# Patient Record
Sex: Male | Born: 1990 | Race: White | Hispanic: Yes | Marital: Married
Health system: Southern US, Community
[De-identification: ages and names within clinical notes are randomized; demographics above are authoritative.]

---
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
## 2018-02-12 ENCOUNTER — Ambulatory Visit (INDEPENDENT_AMBULATORY_CARE_PROVIDER_SITE_OTHER): Payer: Self-pay | Admitting: Physician Assistant

## 2018-02-12 ENCOUNTER — Ambulatory Visit (INDEPENDENT_AMBULATORY_CARE_PROVIDER_SITE_OTHER): Payer: Self-pay

## 2018-02-12 VITALS — BP 142/81 | HR 87 | Temp 97.8°F | Resp 18

## 2018-02-12 DIAGNOSIS — R079 Chest pain, unspecified: Secondary | ICD-10-CM

## 2018-02-12 DIAGNOSIS — R0789 Other chest pain: Secondary | ICD-10-CM

## 2018-02-12 DIAGNOSIS — R748 Abnormal levels of other serum enzymes: Secondary | ICD-10-CM

## 2018-02-12 LAB — POCT CBC
Granulocyte percent: 60.9 %G (ref 37–80)
HCT, POC: 49.7 % (ref 43.5–53.7)
HEMOGLOBIN: 16.9 g/dL (ref 14.1–18.1)
LYMPH, POC: 1.6 (ref 0.6–3.4)
MCH, POC: 32.9 pg — AB (ref 27–31.2)
MCHC: 34.1 g/dL (ref 31.8–35.4)
MCV: 96.6 fL (ref 80–97)
MID (CBC): 0.6 (ref 0–0.9)
MPV: 8.6 fL (ref 0–99.8)
POC Granulocyte: 3.4 (ref 2–6.9)
POC LYMPH %: 28.6 % (ref 10–50)
POC MID %: 10.5 % (ref 0–12)
Platelet Count, POC: 223 10*3/uL (ref 142–424)
RBC: 5.14 M/uL (ref 4.69–6.13)
RDW, POC: 13.3 %
WBC: 5.6 10*3/uL (ref 4.6–10.2)

## 2018-02-12 LAB — GLUCOSE, POCT (MANUAL RESULT ENTRY): POC Glucose: 92 mg/dl (ref 70–99)

## 2018-02-12 LAB — D-DIMER, QUANTITATIVE: D-DIMER: <0.2 mg/L FEU (ref 0.00–0.49)

## 2018-02-12 MED ORDER — HYDROXYZINE HCL 10 MG PO TABS: 10.0000 mg | ORAL_TABLET | Freq: Three times a day (TID) | ORAL | 0 refills | Status: DC | PRN

## 2018-02-12 NOTE — Patient Instructions (Addendum)
You blood work thus far. Physical exam, and chest xray are normal. You can try some hydroxazine while we are waiting on your results.    If you get any burning sensations in your stomach then try some Zantac 150.  This is over the counter.

## 2018-02-12 NOTE — Progress Notes (Signed)
02/12/2018 1:07 PM   DOB: [birth year] / MRN: [id number]  SUBJECTIVE:  Andrew Conway is a 27 y.o. male presenting for atypical chest pain. Symptoms present for about 4 hours.  The problem is no better or worse. He has tried nothing. He is a never smoker. Patient thinks that his morning "tater tots" may have caused this problem.   He has no allergies on file.   He  has no past medical history on file.    He   He  has no sexual activity history on file. The patient  has no past surgical history on file.  His family history is not on file.  Review of Systems  Constitutional: Negative for chills, diaphoresis and fever.  Eyes: Negative.   Respiratory: Negative for cough, hemoptysis, sputum production, shortness of breath and wheezing.   Cardiovascular: Negative for chest pain, orthopnea and leg swelling.  Gastrointestinal: Negative for abdominal pain, blood in stool, constipation, diarrhea, heartburn, melena, nausea and vomiting.  Genitourinary: Negative for dysuria, flank pain, frequency, hematuria and urgency.  Skin: Negative for rash.  Neurological: Positive for dizziness. Negative for sensory change, speech change, focal weakness and headaches.    The problem list and medications were reviewed and updated by myself where necessary and exist elsewhere in the encounter.   OBJECTIVE:  BP (!) 142/81 (BP Location: Right Arm, Patient Position: Sitting, Cuff Size: Normal)   Pulse 87   Temp 97.8 F (36.6 C) (Oral)   Resp 18   SpO2 94%   Orthostatic VS for the past 24 hrs:  BP- Lying BP- Standing at 0 minutes  02/12/18 1250 130/74 130/84   Temp Readings from Last 3 Encounters:  02/12/18 97.8 F (36.6 C) (Oral)   BP Readings from Last 3 Encounters:  02/12/18 (!) 142/81   Pulse Readings from Last 3 Encounters:  02/12/18 87    Physical Exam  Constitutional: He is oriented to person, place, and time. He appears well-developed. He does not appear ill.  Eyes: Pupils are equal,  round, and reactive to light. Conjunctivae and EOM are normal.  Cardiovascular: Normal rate, regular rhythm, S1 normal, S2 normal, normal heart sounds, intact distal pulses and normal pulses. Exam reveals no gallop and no friction rub.  No murmur heard. Pulmonary/Chest: Effort normal. No stridor. No respiratory distress. He has no wheezes. He has no rales.  Abdominal: He exhibits no distension.  Musculoskeletal: Normal range of motion. He exhibits no edema.  Neurological: He is alert and oriented to person, place, and time. No cranial nerve deficit. Coordination normal.  Skin: Skin is warm and dry. He is not diaphoretic.  Psychiatric: He has a normal mood and affect.  Nursing note and vitals reviewed.   No results found for: HGBA1C  Lab Results  Component Value Date   WBC 5.6 02/12/2018   HGB 16.9 02/12/2018   HCT 49.7 02/12/2018   MCV 96.6 02/12/2018     Results for orders placed or performed in visit on 02/12/18  POCT CBC  Result Value Ref Range   WBC 5.6 4.6 - 10.2 K/uL   Lymph, poc 1.6 0.6 - 3.4   POC LYMPH PERCENT 28.6 10 - 50 %L   MID (cbc) 0.6 0 - 0.9   POC MID % 10.5 0 - 12 %M   POC Granulocyte 3.4 2 - 6.9   Granulocyte percent 60.9 37 - 80 %G   RBC 5.14 4.69 - 6.13 M/uL   Hemoglobin 16.9 14.1 - 18.1 g/dL  HCT, POC 49.7 43.5 - 53.7 %   MCV 96.6 80 - 97 fL   MCH, POC 32.9 (A) 27 - 31.2 pg   MCHC 34.1 31.8 - 35.4 g/dL   RDW, POC 16.113.3 %   Platelet Count, POC 223 142 - 424 K/uL   MPV 8.6 0 - 99.8 fL  POCT glucose (manual entry)  Result Value Ref Range   POC Glucose 92 70 - 99 mg/dl     ASSESSMENT AND PLAN:  Diagnoses and all orders for this visit:  Atypical chest pain: Hemodynamically stable without any visible signs of illness. Will screen for PE.   -     EKG 12-Lead -     DG Chest 2 View; Future -     POCT CBC -     POCT glucose (manual entry) -     Comprehensive metabolic panel -     Urinalysis, dipstick only -     D-dimer, quantitative (not at  [facility])    The patient is advised to call or return to clinic if he does not see an improvement in symptoms, or to seek the care of the closest emergency department if he worsens with the above plan.   Deliah BostonMichael Clark, MHS, PA-C [facility] at [facility] 02/12/2018 1:07 PM

## 2018-02-13 LAB — URINALYSIS, DIPSTICK ONLY
Bilirubin, UA: NEGATIVE
Glucose, UA: NEGATIVE
Ketones, UA: NEGATIVE
LEUKOCYTES UA: NEGATIVE
NITRITE UA: NEGATIVE
RBC UA: NEGATIVE
Urobilinogen, Ur: 0.2 mg/dL (ref 0.2–1.0)
pH, UA: 6 (ref 5.0–7.5)

## 2018-02-13 LAB — COMPREHENSIVE METABOLIC PANEL
A/G RATIO: 1.5 (ref 1.2–2.2)
ALBUMIN: 4.6 g/dL (ref 3.5–5.5)
ALK PHOS: 136 IU/L — AB (ref 39–117)
ALT: 117 IU/L — AB (ref 0–44)
AST: 49 IU/L — ABNORMAL HIGH (ref 0–40)
BILIRUBIN TOTAL: 1.1 mg/dL (ref 0.0–1.2)
BUN / CREAT RATIO: 21 — AB (ref 9–20)
BUN: 16 mg/dL (ref 6–20)
CHLORIDE: 104 mmol/L (ref 96–106)
CO2: 20 mmol/L (ref 20–29)
Calcium: 9.2 mg/dL (ref 8.7–10.2)
Creatinine, Ser: 0.75 mg/dL — ABNORMAL LOW (ref 0.76–1.27)
GFR calc Af Amer: 145 mL/min/{1.73_m2} (ref >59)
GFR calc non Af Amer: 126 mL/min/{1.73_m2} (ref >59)
GLUCOSE: 107 mg/dL — AB (ref 65–99)
Globulin, Total: 3.1 g/dL (ref 1.5–4.5)
POTASSIUM: 5 mmol/L (ref 3.5–5.2)
Sodium: 141 mmol/L (ref 134–144)
Total Protein: 7.7 g/dL (ref 6.0–8.5)

## 2018-02-13 LAB — LIPID PANEL
CHOL/HDL RATIO: 5 ratio (ref 0.0–5.0)
Cholesterol, Total: 151 mg/dL (ref 100–199)
HDL: 30 mg/dL — ABNORMAL LOW (ref >39)
Triglycerides: 403 mg/dL — ABNORMAL HIGH (ref 0–149)

## 2018-02-14 NOTE — Progress Notes (Signed)
Please add on lipase. Dx abnormal liver enzymes. Deliah BostonMichael Jaclene Bartelt, MS, PA-C 2:13 PM, 02/14/2018

## 2018-02-15 ENCOUNTER — Telehealth: Payer: Self-pay | Admitting: Physician Assistant

## 2018-02-15 NOTE — Addendum Note (Signed)
Addended by: Rogelia RohrerMCADOO, Ashliegh Parekh K on: 02/15/2018 08:23 AM   Modules accepted: Orders

## 2018-02-15 NOTE — Telephone Encounter (Signed)
Copied from CRM [id number]. Topic: Quick Communication - Lab Results >> Feb 15, 2018  3:09 PM Percival SpanishKennedy, Cheryl W wrote: Would like a call back about lab results     [phone]

## 2018-02-16 ENCOUNTER — Other Ambulatory Visit: Payer: Self-pay | Admitting: Physician Assistant

## 2018-02-16 DIAGNOSIS — R945 Abnormal results of liver function studies: Principal | ICD-10-CM

## 2018-02-16 DIAGNOSIS — R7989 Other specified abnormal findings of blood chemistry: Secondary | ICD-10-CM

## 2018-02-16 LAB — LIPASE: Lipase: 31 U/L (ref 13–78)

## 2018-02-16 NOTE — Progress Notes (Signed)
Please relay the message below

## 2018-02-17 NOTE — Telephone Encounter (Signed)
Spoke to pt.  Gave results as listed with Deliah BostonMichael Clark  Sent message to Referrals.

## 2018-02-19 NOTE — Telephone Encounter (Signed)
[facility]

## 2018-12-22 IMAGING — DX DG CHEST 2V
2 series · 2 of 2 positions shown · non-contrast
Comparison: None.

CLINICAL DATA: Chest pain

EXAM:
CHEST - 2 VIEW

[chest pa]
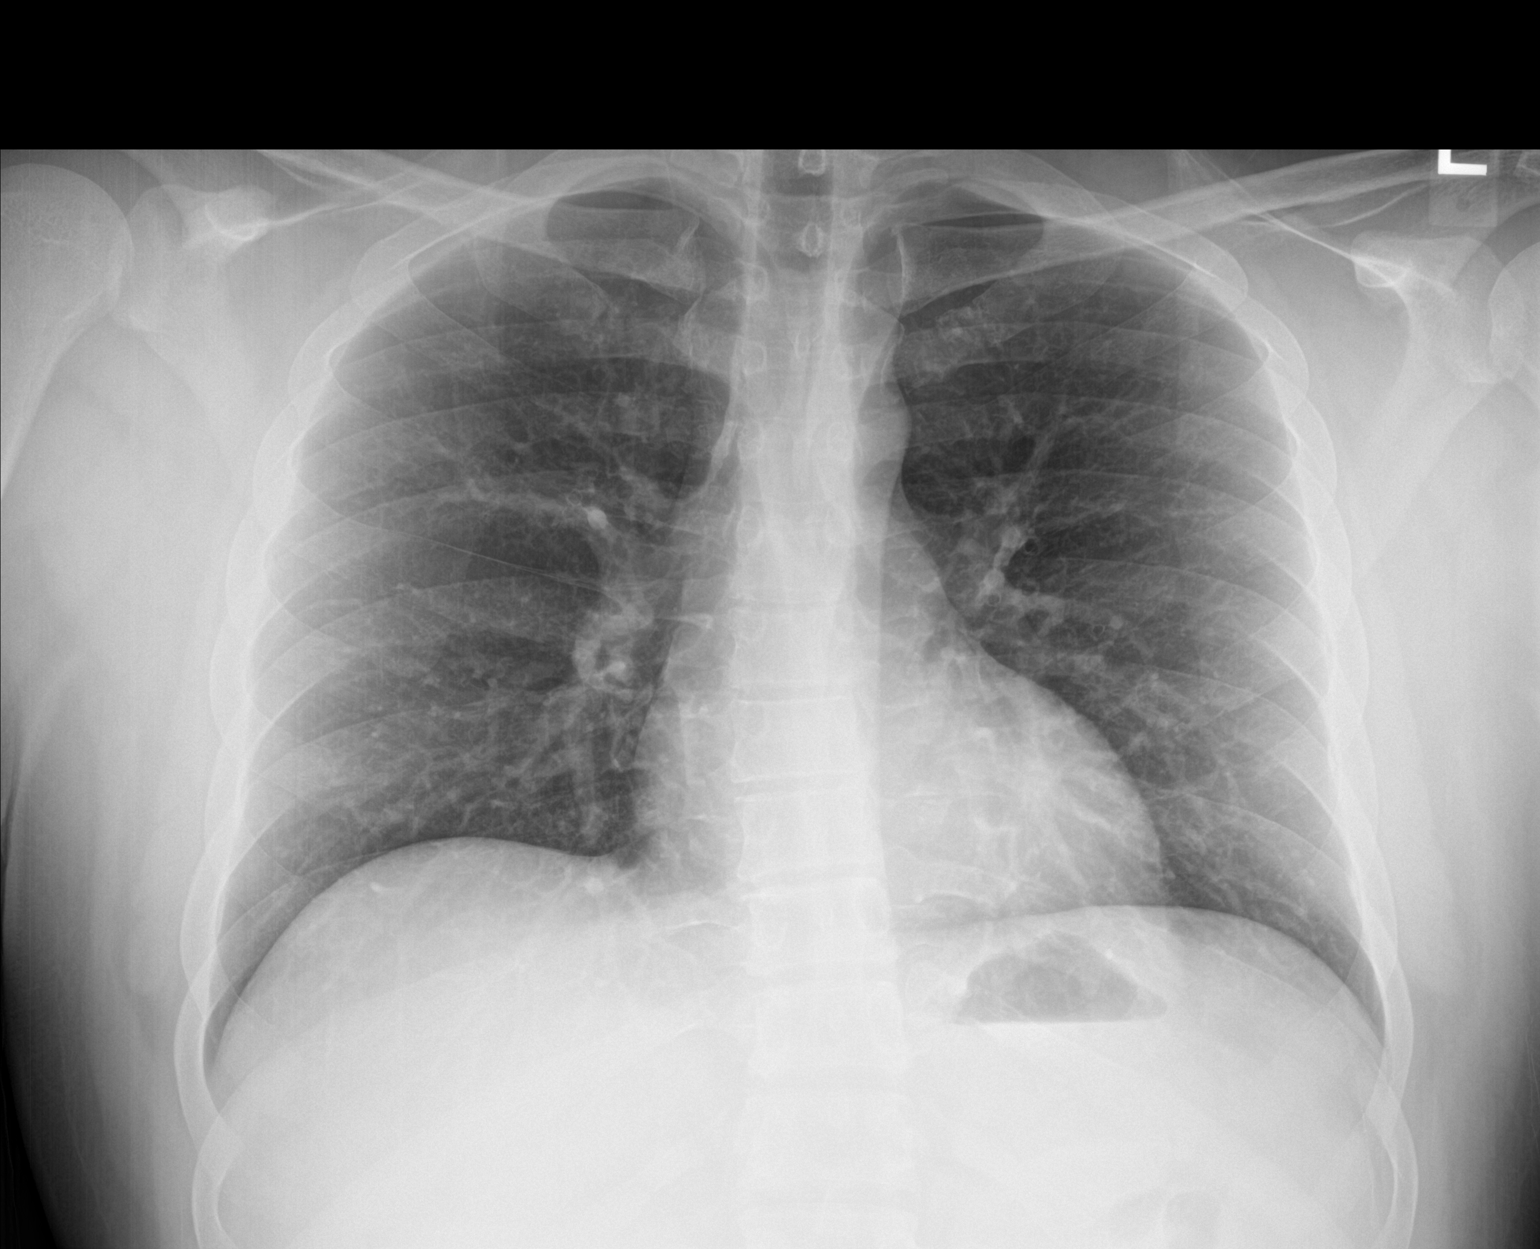

[chest lat]
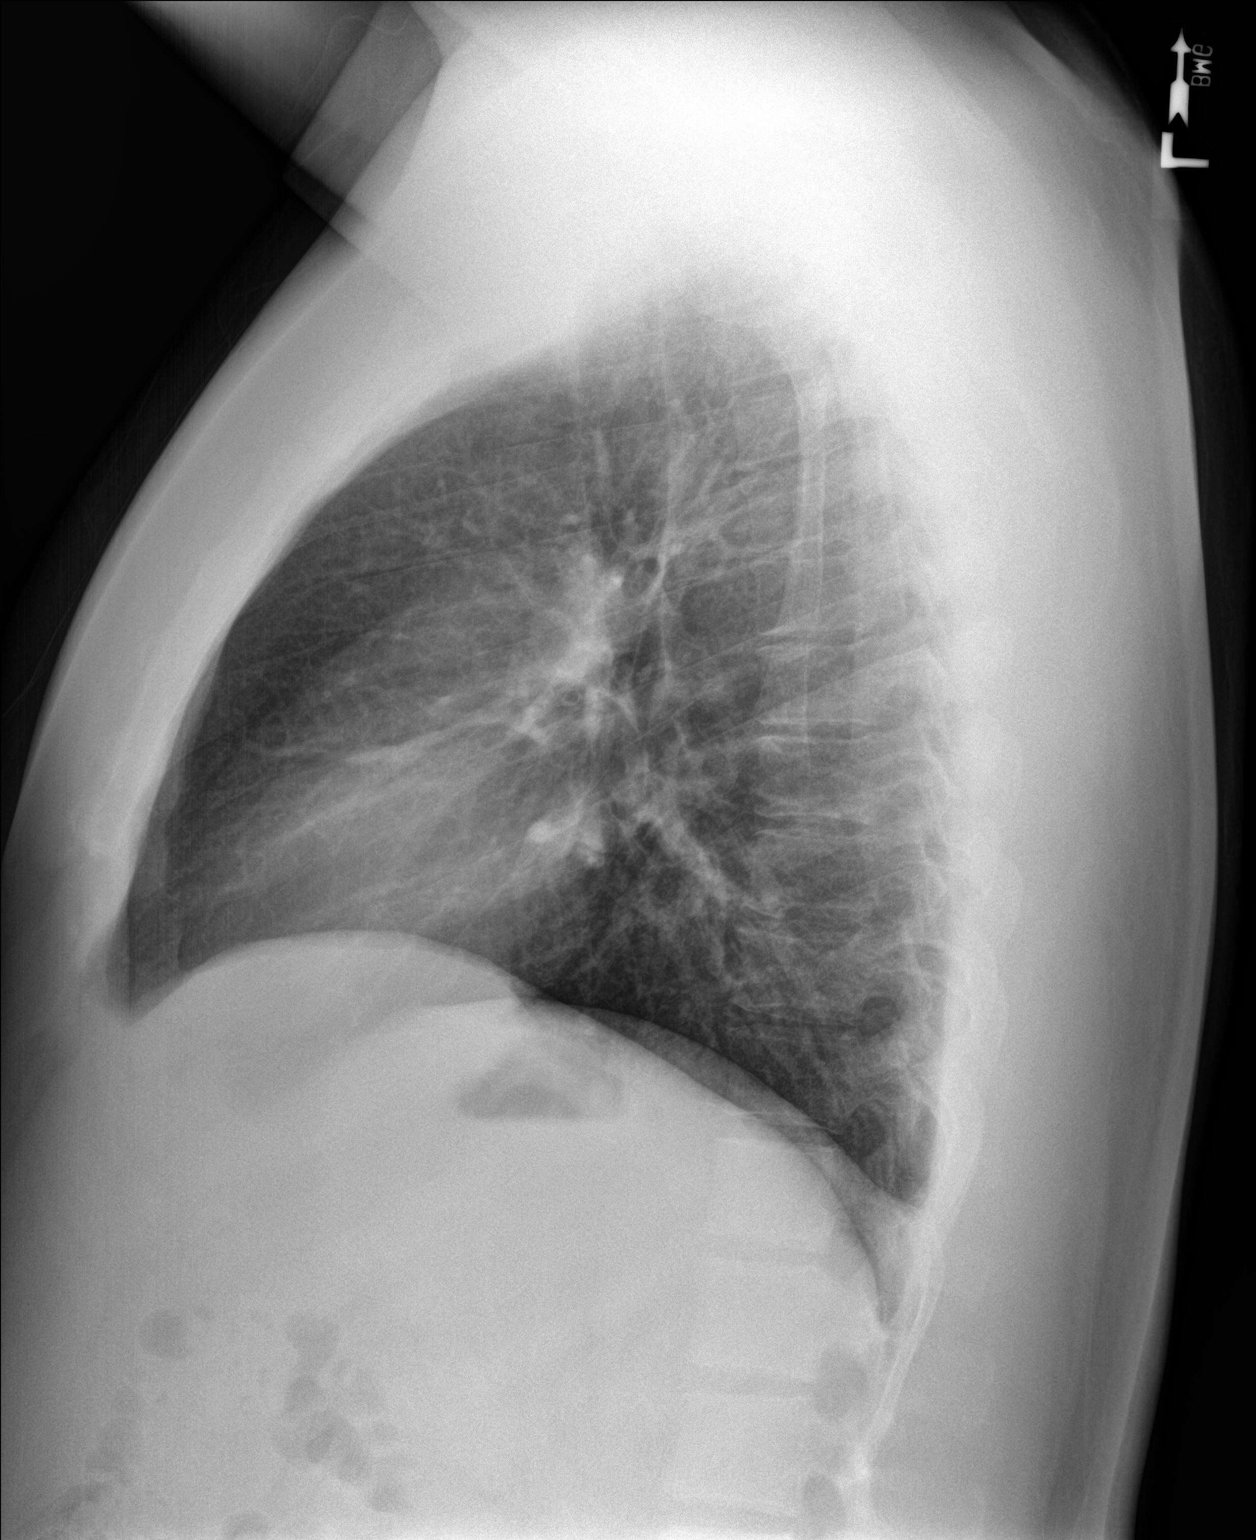

[2 of 2 positions shown; findings below may reference images not displayed]

FINDINGS: The heart size and mediastinal contours are within normal limits.
Both lungs are clear. The visualized skeletal structures are
unremarkable.
IMPRESSION: No active cardiopulmonary disease.

## 2021-12-04 ENCOUNTER — Emergency Department (HOSPITAL_COMMUNITY): Payer: Self-pay

## 2021-12-04 ENCOUNTER — Encounter (HOSPITAL_COMMUNITY): Payer: Self-pay

## 2021-12-04 ENCOUNTER — Other Ambulatory Visit: Payer: Self-pay

## 2021-12-04 ENCOUNTER — Emergency Department (HOSPITAL_COMMUNITY)
Admission: EM | Admit: 2021-12-04 | Discharge: 2021-12-04 | Disposition: A | Discharge status: Home / Self Care | Payer: Self-pay | Attending: Emergency Medicine | Admitting: Emergency Medicine

## 2021-12-04 DIAGNOSIS — R112 Nausea with vomiting, unspecified: Secondary | ICD-10-CM

## 2021-12-04 DIAGNOSIS — R7401 Elevation of levels of liver transaminase levels: Secondary | ICD-10-CM | POA: Insufficient documentation

## 2021-12-04 DIAGNOSIS — K824 Cholesterolosis of gallbladder: Secondary | ICD-10-CM | POA: Insufficient documentation

## 2021-12-04 DIAGNOSIS — R509 Fever, unspecified: Secondary | ICD-10-CM | POA: Insufficient documentation

## 2021-12-04 DIAGNOSIS — Z20822 Contact with and (suspected) exposure to covid-19: Secondary | ICD-10-CM | POA: Insufficient documentation

## 2021-12-04 DIAGNOSIS — D72829 Elevated white blood cell count, unspecified: Secondary | ICD-10-CM | POA: Insufficient documentation

## 2021-12-04 DIAGNOSIS — K700 Alcoholic fatty liver: Secondary | ICD-10-CM | POA: Insufficient documentation

## 2021-12-04 DIAGNOSIS — R519 Headache, unspecified: Secondary | ICD-10-CM | POA: Insufficient documentation

## 2021-12-04 DIAGNOSIS — K760 Fatty (change of) liver, not elsewhere classified: Secondary | ICD-10-CM

## 2021-12-04 LAB — CBC WITH DIFFERENTIAL/PLATELET
Abs Immature Granulocytes: 0.04 10*3/uL (ref 0.00–0.07)
Basophils Absolute: 0 10*3/uL (ref 0.0–0.1)
Basophils Relative: 0 %
Eosinophils Absolute: 0 10*3/uL (ref 0.0–0.5)
Eosinophils Relative: 0 %
HCT: 44.7 % (ref 39.0–52.0)
Hemoglobin: 16.6 g/dL (ref 13.0–17.0)
Immature Granulocytes: 0 %
Lymphocytes Relative: 12 %
Lymphs Abs: 1.5 10*3/uL (ref 0.7–4.0)
MCH: 33.2 pg (ref 26.0–34.0)
MCHC: 37.1 g/dL — ABNORMAL HIGH (ref 30.0–36.0)
MCV: 89.4 fL (ref 80.0–100.0)
Monocytes Absolute: 1.2 10*3/uL — ABNORMAL HIGH (ref 0.1–1.0)
Monocytes Relative: 10 %
Neutro Abs: 9.2 10*3/uL — ABNORMAL HIGH (ref 1.7–7.7)
Neutrophils Relative %: 78 %
Platelets: 200 10*3/uL (ref 150–400)
RBC: 5 MIL/uL (ref 4.22–5.81)
RDW: 12.6 % (ref 11.5–15.5)
WBC: 11.9 10*3/uL — ABNORMAL HIGH (ref 4.0–10.5)
nRBC: 0 % (ref 0.0–0.2)

## 2021-12-04 LAB — COMPREHENSIVE METABOLIC PANEL
ALT: 55 U/L — ABNORMAL HIGH (ref 0–44)
AST: 24 U/L (ref 15–41)
Albumin: 4.3 g/dL (ref 3.5–5.0)
Alkaline Phosphatase: 103 U/L (ref 38–126)
Anion gap: 9 (ref 5–15)
BUN: 13 mg/dL (ref 6–20)
CO2: 24 mmol/L (ref 22–32)
Calcium: 8.8 mg/dL — ABNORMAL LOW (ref 8.9–10.3)
Chloride: 103 mmol/L (ref 98–111)
Creatinine, Ser: 0.74 mg/dL (ref 0.61–1.24)
GFR, Estimated: >60 mL/min (ref >60)
Glucose, Bld: 96 mg/dL (ref 70–99)
Potassium: 4.5 mmol/L (ref 3.5–5.1)
Sodium: 136 mmol/L (ref 135–145)
Total Bilirubin: 2.3 mg/dL — ABNORMAL HIGH (ref 0.3–1.2)
Total Protein: 8.2 g/dL — ABNORMAL HIGH (ref 6.5–8.1)

## 2021-12-04 LAB — RESP PANEL BY RT-PCR (FLU A&B, COVID) ARPGX2
Influenza A by PCR: NEGATIVE
Influenza B by PCR: NEGATIVE
SARS Coronavirus 2 by RT PCR: NEGATIVE

## 2021-12-04 LAB — APTT: aPTT: 31 seconds (ref 24–36)

## 2021-12-04 LAB — URINALYSIS, ROUTINE W REFLEX MICROSCOPIC
Bilirubin Urine: NEGATIVE
Glucose, UA: NEGATIVE mg/dL
Hgb urine dipstick: NEGATIVE
Ketones, ur: NEGATIVE mg/dL
Leukocytes,Ua: NEGATIVE
Nitrite: NEGATIVE
Protein, ur: NEGATIVE mg/dL
Specific Gravity, Urine: 1.025 (ref 1.005–1.030)
pH: 6 (ref 5.0–8.0)

## 2021-12-04 LAB — LACTIC ACID, PLASMA: Lactic Acid, Venous: 1 mmol/L (ref 0.5–1.9)

## 2021-12-04 LAB — PROTIME-INR
INR: 1.2 (ref 0.8–1.2)
Prothrombin Time: 14.7 seconds (ref 11.4–15.2)

## 2021-12-04 MED ORDER — ACETAMINOPHEN 325 MG PO TABS
650.0000 mg | ORAL_TABLET | Freq: Once | ORAL | Status: DC
Start: 2021-12-04 — End: 2021-12-04

## 2021-12-04 MED ORDER — ACETAMINOPHEN 500 MG PO TABS
1000.0000 mg | ORAL_TABLET | Freq: Once | ORAL | Status: AC
  Administered 2021-12-04: 1000 mg via ORAL
  Filled 2021-12-04: qty 2

## 2021-12-04 MED ORDER — ONDANSETRON HCL 8 MG PO TABS: 8.0000 mg | ORAL_TABLET | Freq: Three times a day (TID) | ORAL | 0 refills | Status: DC | PRN

## 2021-12-04 NOTE — ED Provider Triage Note (Signed)
Emergency Medicine Provider Triage Evaluation Note ? ?Andrew Conway , a 31 y.o. male  was evaluated in triage.  Patient complains of abdominal pain since eating a fried sandwich with steak on Thursday.  Since then he has also been experiencing fevers as high as 102.  Has been taking Tylenol at home but continues to have recurrent fevers and severe abdominal pain. ? ?Review of Systems  ?Positive: Abdominal pain, nausea, fevers and chills ?Negative: Vomiting, diarrhea ? ?Physical Exam  ?BP (!) 115/93 (BP Location: Right Arm)   Pulse (!) 105   Temp (!) 101.9 ?F (38.8 ?C) (Oral)   Resp 16   SpO2 97%  ?Gen:   Awake, no distress   ?Resp:  Normal effort  ?MSK:   Moves extremities without difficulty  ?Other:  Right upper quadrant tenderness ? ?Medical Decision Making  ?Medically screening exam initiated at 1:32 PM.  Appropriate orders placed.  Andrew Conway was informed that the remainder of the evaluation will be completed by another provider, this initial triage assessment does not replace that evaluation, and the importance of remaining in the ED until their evaluation is complete. ? ? ? ?Febrile and tachycardic on presentation.  Question gallbladder versus gastroenteritis. ?  ?Rhae Hammock, PA-C ?12/04/21 1333 ? ?

## 2021-12-04 NOTE — Discharge Instructions (Addendum)
Please use acetaminophen 650 mg or ibuprofen 600 mg as needed for fever ?Please call for follow-up of your gallbladder polyp at the [facility] or the gastroenterology office. ?Return if you are having worsening symptoms especially inability to tolerate fluids, worsening pain or fever lasting more than 2 to 3 days ?

## 2021-12-04 NOTE — ED Triage Notes (Signed)
Patient complains of headache, fever, abdominal pain with nausea sine Thursday. No vomiting, no diarrhea. Alert and oriented ?

## 2021-12-04 NOTE — ED Provider Notes (Signed)
?[facility] ?Provider Note ? ? ?CSN: [id number] ?Arrival date & time: 12/04/21  1246 ? ?  ? ?History ? ?No chief complaint on file. ? ? ?Andrew Conway is a 31 y.o. male. ? ?HPI ? ?31 year old male no significant past medical history presents today complaining of fever, headache, nausea, vomiting, and some right upper quadrant discomfort for several days.  Patient seen prior to my evaluation in triage and had acetaminophen, labs, and right upper quadrant ultrasound.  Ultrasound is significant for polyp but no stones and no pericholecystic inflammation consistent with cholecystitis.  He does have some fatty infiltrates of his liver.  No other known sick contacts.  No cough, diarrhea. ?  ? ?Home Medications ?Prior to Admission medications   ?Medication Sig Start Date End Date Taking? Authorizing Provider  ?ondansetron (ZOFRAN) 8 MG tablet Take 1 tablet (8 mg total) by mouth every 8 (eight) hours as needed for nausea or vomiting. 12/04/21  Yes Pattricia Boss, MD  ?hydrOXYzine (ATARAX/VISTARIL) 10 MG tablet Take 1-3 tablets (10-30 mg total) by mouth 3 (three) times daily as needed. 02/12/18   Tereasa Coop, PA-C  ?   ? ?Allergies    ?Patient has no known allergies.   ? ?Review of Systems   ?Review of Systems  ?All other systems reviewed and are negative. ? ?Physical Exam ?Updated Vital Signs ?BP 105/73   Pulse 97   Temp 98.9 ?F (37.2 ?C) (Oral)   Resp 16   SpO2 96%  ?Physical Exam ?Vitals and nursing note reviewed.  ?Constitutional:   ?   Appearance: Normal appearance. He is obese.  ?HENT:  ?   Head: Normocephalic and atraumatic.  ?   Right Ear: External ear normal.  ?   Left Ear: External ear normal.  ?   Nose: Nose normal.  ?   Mouth/Throat:  ?   Mouth: Mucous membranes are dry.  ?   Pharynx: Oropharynx is clear.  ?Eyes:  ?   Extraocular Movements: Extraocular movements intact.  ?   Pupils: Pupils are equal, round, and reactive to light.  ?Cardiovascular:  ?   Rate and Rhythm:  Normal rate and regular rhythm.  ?   Pulses: Normal pulses.  ?Pulmonary:  ?   Effort: Pulmonary effort is normal.  ?   Breath sounds: Normal breath sounds.  ?Abdominal:  ?   General: Abdomen is flat. Bowel sounds are normal.  ?   Tenderness: There is abdominal tenderness.  ?   Comments: Mild right upper quadrant tenderness to palpation no other tenderness noted on exam  ?Musculoskeletal:     ?   General: Normal range of motion.  ?   Cervical back: Normal range of motion.  ?Skin: ?   General: Skin is warm and dry.  ?   Capillary Refill: Capillary refill takes less than 2 seconds.  ?Neurological:  ?   General: No focal deficit present.  ?   Mental Status: He is alert.  ?Psychiatric:     ?   Mood and Affect: Mood normal.  ? ? ?ED Results / Procedures / Treatments   ?Labs ?(all labs ordered are listed, but only abnormal results are displayed) ?Labs Reviewed  ?COMPREHENSIVE METABOLIC PANEL - Abnormal; Notable for the following components:  ?    Result Value  ? Calcium 8.8 (*)   ? Total Protein 8.2 (*)   ? ALT 55 (*)   ? Total Bilirubin 2.3 (*)   ? All other components within  normal limits  ?CBC WITH DIFFERENTIAL/PLATELET - Abnormal; Notable for the following components:  ? WBC 11.9 (*)   ? MCHC 37.1 (*)   ? Neutro Abs 9.2 (*)   ? Monocytes Absolute 1.2 (*)   ? All other components within normal limits  ?CULTURE, BLOOD (ROUTINE X 2)  ?CULTURE, BLOOD (ROUTINE X 2)  ?RESP PANEL BY RT-PCR (FLU A&B, COVID) ARPGX2  ?LACTIC ACID, PLASMA  ?PROTIME-INR  ?APTT  ?URINALYSIS, ROUTINE W REFLEX MICROSCOPIC  ?LACTIC ACID, PLASMA  ?LIPASE, BLOOD  ? ? ?EKG ?None ? ?Radiology ?US Abdomen Limited RUQ (LIVER/GB) ? ?Result Date: 12/04/2021 ?CLINICAL DATA:  Abdominal pain for 2 days with nausea. EXAM: ULTRASOUND ABDOMEN LIMITED RIGHT UPPER QUADRANT COMPARISON:  None. FINDINGS: Gallbladder: 4 mm polyp. No shadowing stones. No wall thickening or pericholecystic fluid. Common bile duct: Diameter: 3 mm Liver: Somewhat heterogeneous with diffusely  increased parenchymal echogenicity. No mass or focal lesion. Portal vein is patent on color Doppler imaging with normal direction of blood flow towards the liver. Other: None. IMPRESSION: 1. No acute findings. No evidence of acute cholecystitis. No gallstones. 2. Hepatic steatosis. 3. 4 mm gallbladder polyp. Electronically Signed   By: Lajean Manes M.D.   On: 12/04/2021 14:39   ? ?Procedures ?Procedures  ? ? ?Medications Ordered in ED ?Medications  ?acetaminophen (TYLENOL) tablet 650 mg (has no administration in time range)  ?acetaminophen (TYLENOL) tablet 1,000 mg (1,000 mg Oral Given 12/04/21 1342)  ? ? ?ED Course/ Medical Decision Making/ A&P ?Clinical Course as of 12/04/21 1809  ?Sat Dec 04, 2021  ?1611 Received reviewed interpreted with leukocytosis 11,900 [DR]  ?1611 Comprehensive metabolic panel(!) ?Point metabolic reviewed and interpreted and elevated bilirubin 2.3 and elevated ALT at 55 AST and alk phos normal [DR]  ?1611 Right upper quadrant ultrasound reviewed and 4 mm gallbladder polyp no evidence of acute cholecystitis, no gallstones somewhat heterogenous diffusely increased parenchymal echogenicity [DR]  ?  ?Clinical Course User Index ?[DR] Pattricia Boss, MD  ? ?                        ?Medical Decision Making ?31 year old male with nausea vomiting fever and mild right upper quadrant pain.  Denies urinary tract infection symptoms.  He has not had any blood in his urine.  On initial evaluation, he has mild right upper quadrant tenderness.  Already been evaluated with labs and ultrasound prior to my evaluation.  He does have a mildly elevated bilirubin 2.3 and ALT at 55 with AST and alk phos normal.  4 mm gallbladder polyp but no other evidence of acute cholecystitis noted.  However, consider hepatitis, or other intra acute intra-abdominal problems discussed possibility of pneumonia with infiltrate in lower lobes. ?Patient offered further lab work-up, chest x-Sherill Wegener, CT, does not wish to proceed at this  time.  In the interim, he has received acetaminophen fever has decreased vital signs are normal.  He feels improved at this time. ?Discussed return precautions ?Will refer for follow-up outpatient of gallbladder polyp ? ?Amount and/or Complexity of Data Reviewed ?Labs:  Decision-making details documented in ED Course. ?Radiology: ordered. ? ?Risk ?OTC drugs. ? ? ? ? ? ? ? ? ? ? ?Final Clinical Impression(s) / ED Diagnoses ?Final diagnoses:  ?Nausea and vomiting, unspecified vomiting type  ?Fever, unspecified fever cause  ?Gallbladder polyp  ?Hepatic steatosis  ? ? ?Rx / DC Orders ?ED Discharge Orders   ? ?      Ordered  ?  ondansetron (ZOFRAN) 8 MG tablet  Every 8 hours PRN       ? 12/04/21 1807  ? ?  ?  ? ?  ? ? ?  ?Pattricia Boss, MD ?12/04/21 1809 ? ?

## 2021-12-09 LAB — CULTURE, BLOOD (ROUTINE X 2)
Culture: NO GROWTH
Special Requests: ADEQUATE

## 2022-10-13 IMAGING — US US ABDOMEN LIMITED
1 series · 14 of 25 positions shown · non-contrast
Comparison: None.

CLINICAL DATA: Abdominal pain for 2 days with nausea.

EXAM:
ULTRASOUND ABDOMEN LIMITED RIGHT UPPER QUADRANT

[Series 1: us abdomen limited ruq (liver/gb) · 14 of 82 slices shown]
[im 1/82]
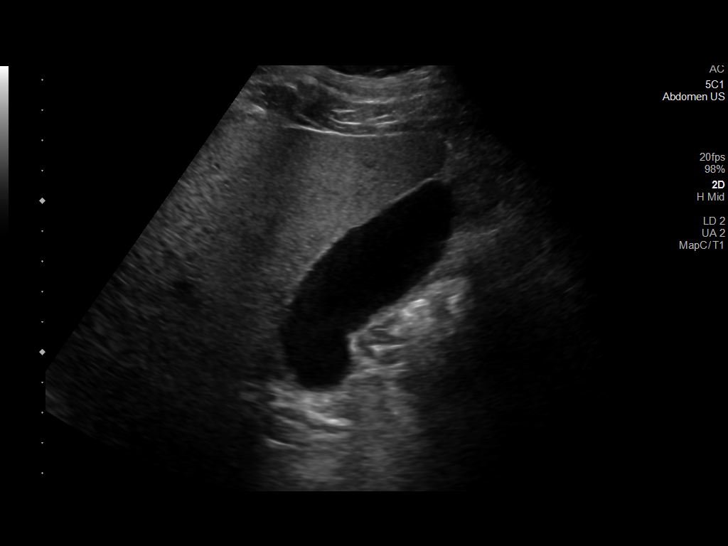
[im 7/82]
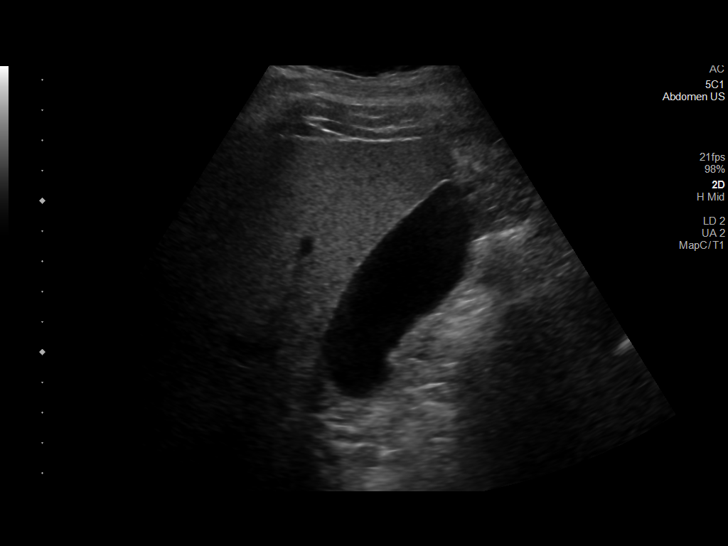
[im 14/82]
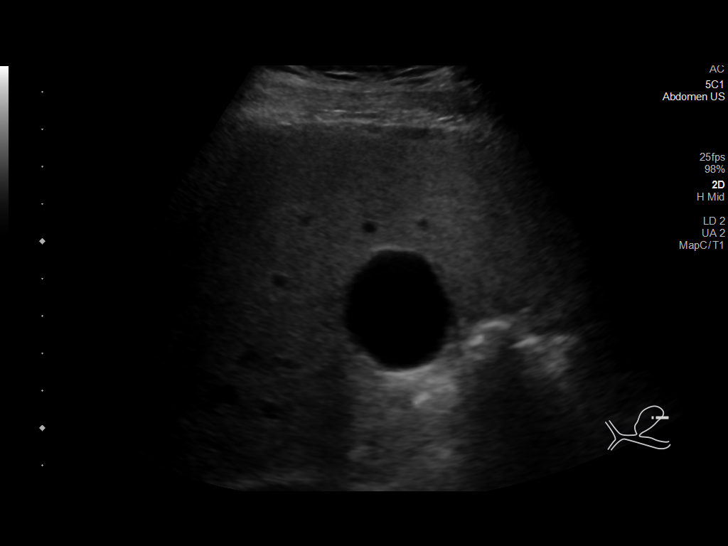
[im 21/82]
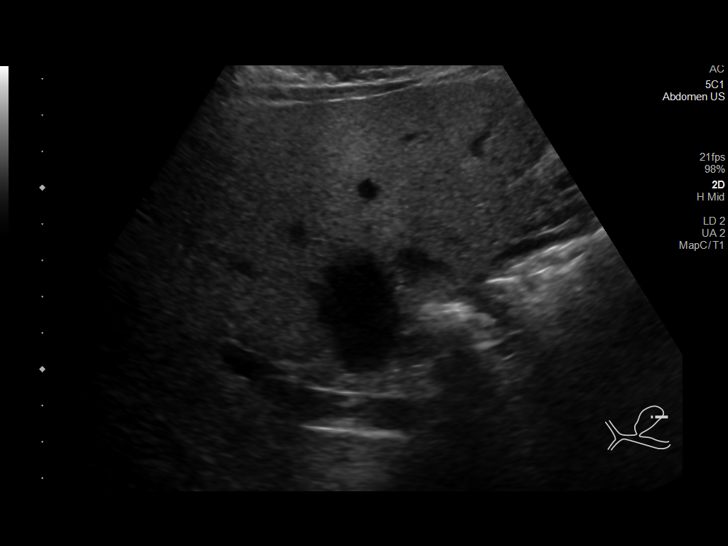
[im 28/82]
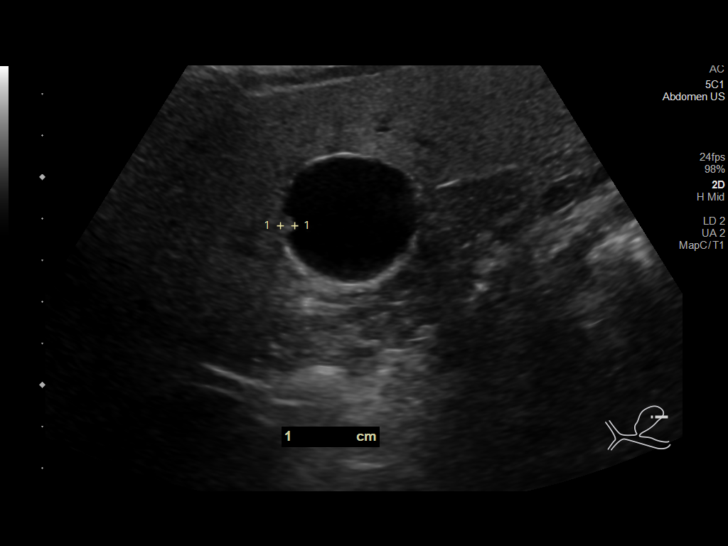
[im 31/82]
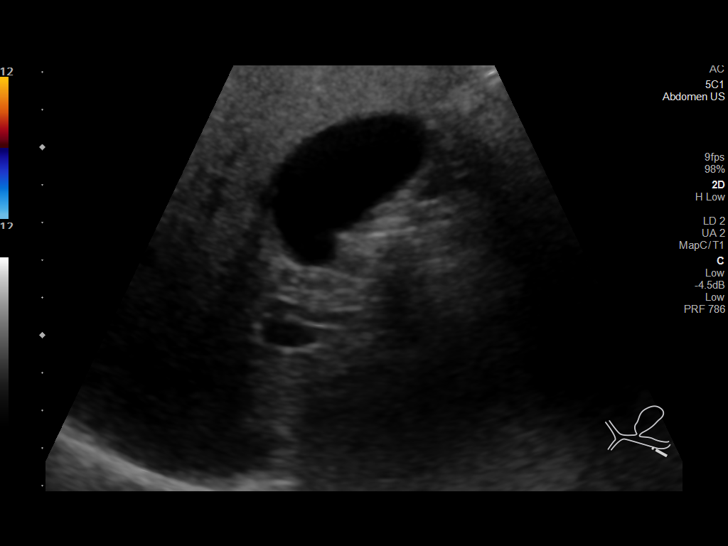
[im 38/82]
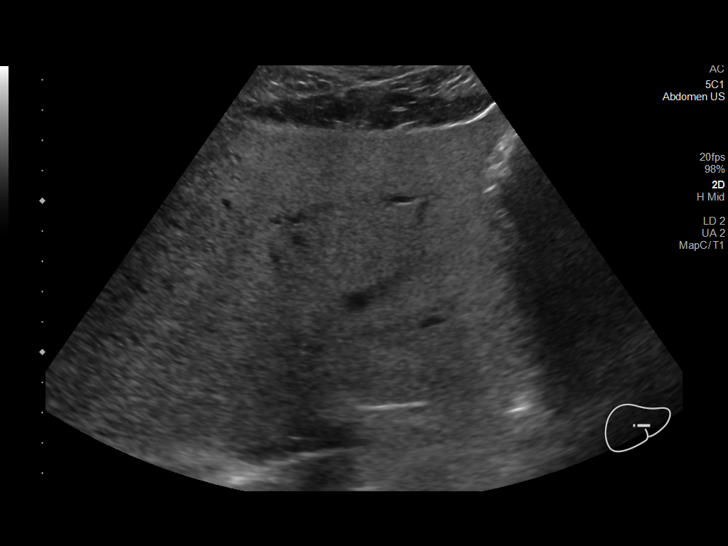
[im 44/82]
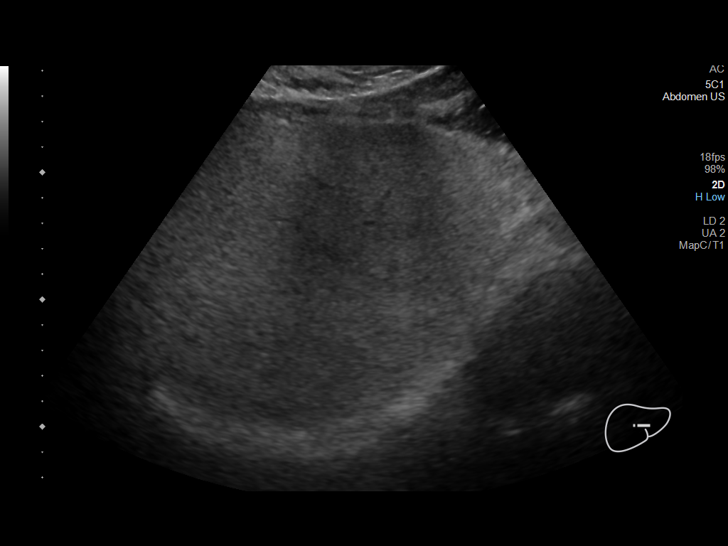
[im 51/82]
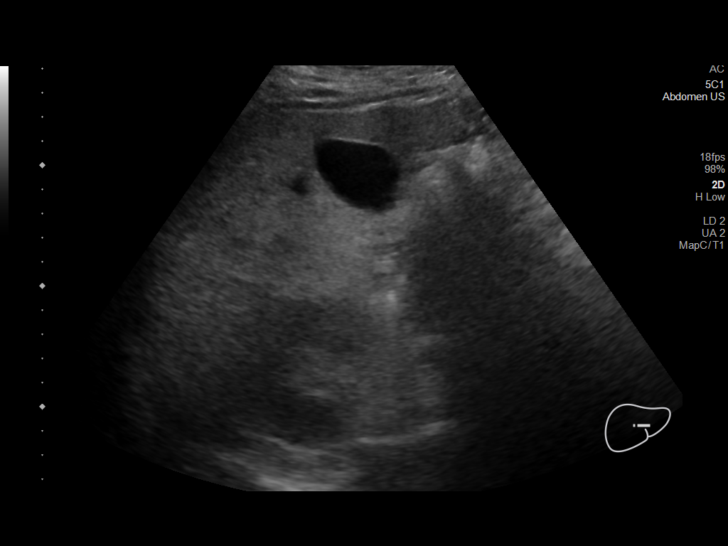
[im 55/82]
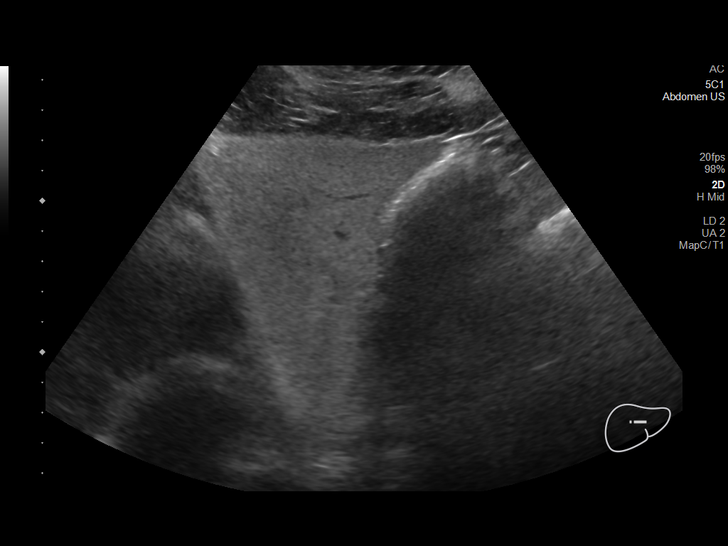
[im 61/82]
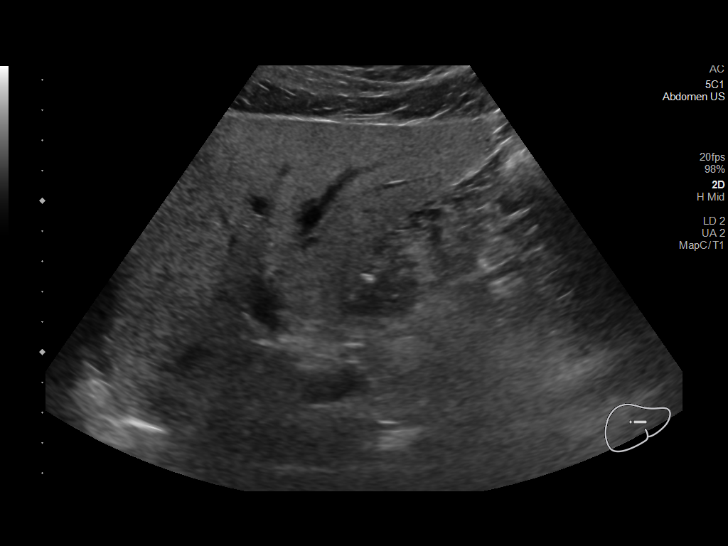
[im 68/82]
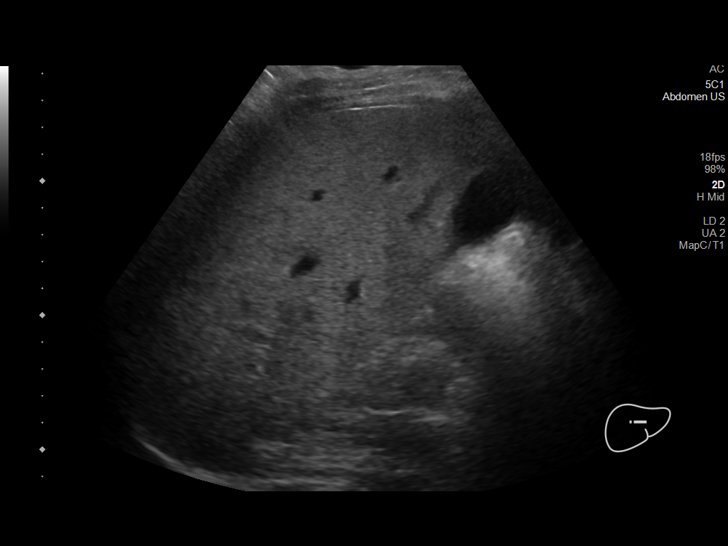
[im 75/82]
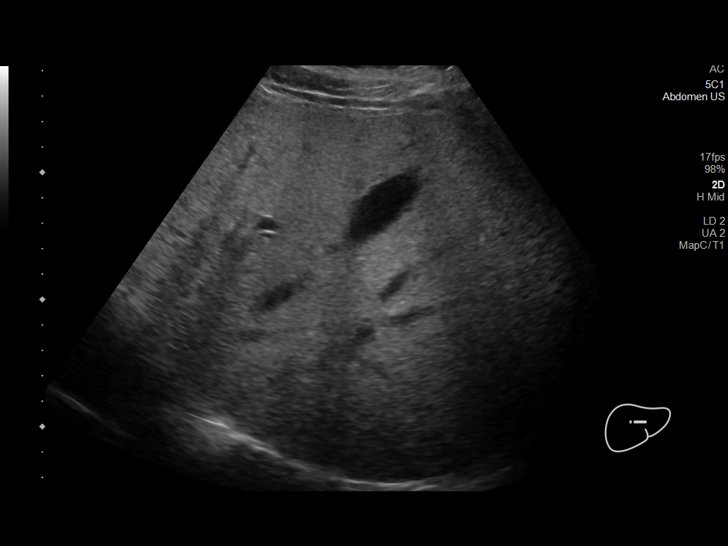
[im 82/82]
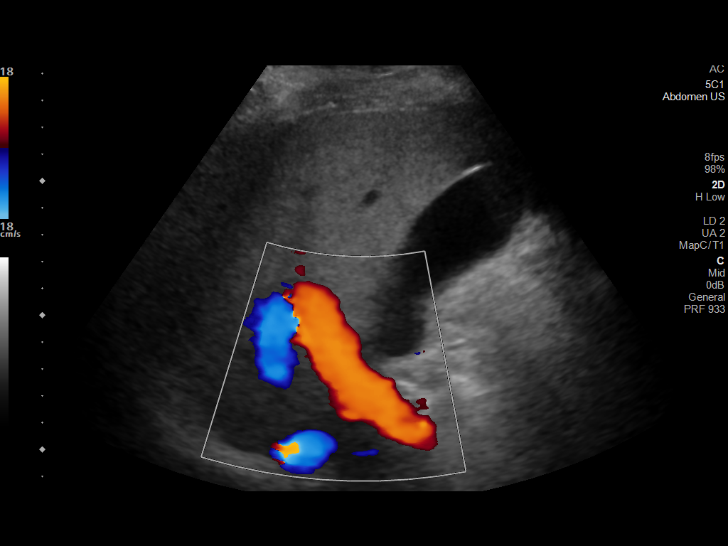

[14 of 25 positions shown; findings below may reference images not displayed]

FINDINGS: Gallbladder:

4 mm polyp. No shadowing stones. No wall thickening or
pericholecystic fluid.

Common bile duct:

Diameter: 3 mm

Liver:

Somewhat heterogeneous with diffusely increased parenchymal
echogenicity. No mass or focal lesion. Portal vein is patent on
color Doppler imaging with normal direction of blood flow towards
the liver.

Other: None.
IMPRESSION: 1. No acute findings. No evidence of acute cholecystitis. No
gallstones.
2. Hepatic steatosis.
3. 4 mm gallbladder polyp.

## 2024-01-12 ENCOUNTER — Ambulatory Visit (HOSPITAL_COMMUNITY): Payer: Self-pay

## 2024-04-05 ENCOUNTER — Ambulatory Visit: Payer: Self-pay | Admitting: Family Medicine

## 2024-04-05 ENCOUNTER — Encounter: Payer: Self-pay | Admitting: Family Medicine

## 2024-04-05 VITALS — BP 110/88 | HR 105 | Temp 98.0°F | Ht 65.0 in | Wt 183.0 lb

## 2024-04-05 DIAGNOSIS — E6609 Other obesity due to excess calories: Secondary | ICD-10-CM | POA: Diagnosis not present

## 2024-04-05 DIAGNOSIS — N4601 Organic azoospermia: Secondary | ICD-10-CM

## 2024-04-05 DIAGNOSIS — Z0000 Encounter for general adult medical examination without abnormal findings: Secondary | ICD-10-CM

## 2024-04-05 DIAGNOSIS — Z7689 Persons encountering health services in other specified circumstances: Secondary | ICD-10-CM | POA: Diagnosis not present

## 2024-04-05 DIAGNOSIS — R748 Abnormal levels of other serum enzymes: Secondary | ICD-10-CM

## 2024-04-05 DIAGNOSIS — Z6830 Body mass index (BMI) 30.0-30.9, adult: Secondary | ICD-10-CM | POA: Diagnosis not present

## 2024-04-05 DIAGNOSIS — E66811 Obesity, class 1: Secondary | ICD-10-CM

## 2024-04-05 LAB — CBC WITH DIFFERENTIAL/PLATELET
Basophils Absolute: 0 K/uL (ref 0.0–0.1)
Basophils Relative: 0.5 % (ref 0.0–3.0)
Eosinophils Absolute: 0.1 K/uL (ref 0.0–0.7)
Eosinophils Relative: 2 % (ref 0.0–5.0)
HCT: 45.2 % (ref 39.0–52.0)
Hemoglobin: 16.1 g/dL (ref 13.0–17.0)
Lymphocytes Relative: 18.5 % (ref 12.0–46.0)
Lymphs Abs: 1.3 K/uL (ref 0.7–4.0)
MCHC: 35.5 g/dL (ref 30.0–36.0)
MCV: 93 fl (ref 78.0–100.0)
Monocytes Absolute: 0.5 K/uL (ref 0.1–1.0)
Monocytes Relative: 6.4 % (ref 3.0–12.0)
Neutro Abs: 5.1 K/uL (ref 1.4–7.7)
Neutrophils Relative %: 72.6 % (ref 43.0–77.0)
Platelets: 198 K/uL (ref 150.0–400.0)
RBC: 4.86 Mil/uL (ref 4.22–5.81)
RDW: 13 % (ref 11.5–15.5)
WBC: 7.1 K/uL (ref 4.0–10.5)

## 2024-04-05 LAB — COMPREHENSIVE METABOLIC PANEL WITH GFR
ALT: 60 U/L — ABNORMAL HIGH (ref 0–53)
AST: 33 U/L (ref 0–37)
Albumin: 4.6 g/dL (ref 3.5–5.2)
Alkaline Phosphatase: 113 U/L (ref 39–117)
BUN: 20 mg/dL (ref 6–23)
CO2: 25 meq/L (ref 19–32)
Calcium: 9.2 mg/dL (ref 8.4–10.5)
Chloride: 104 meq/L (ref 96–112)
Creatinine, Ser: 0.81 mg/dL (ref 0.40–1.50)
GFR: 116 mL/min (ref >60.00)
Glucose, Bld: 77 mg/dL (ref 70–99)
Potassium: 3.7 meq/L (ref 3.5–5.1)
Sodium: 138 meq/L (ref 135–145)
Total Bilirubin: 1.3 mg/dL — ABNORMAL HIGH (ref 0.2–1.2)
Total Protein: 7.9 g/dL (ref 6.0–8.3)

## 2024-04-05 LAB — LIPID PANEL
Cholesterol: 159 mg/dL (ref 0–200)
HDL: 34.6 mg/dL — ABNORMAL LOW (ref >39.00)
LDL Cholesterol: 51 mg/dL (ref 0–99)
NonHDL: 124.73
Total CHOL/HDL Ratio: 5
Triglycerides: 370 mg/dL — ABNORMAL HIGH (ref 0.0–149.0)
VLDL: 74 mg/dL — ABNORMAL HIGH (ref 0.0–40.0)

## 2024-04-05 LAB — TSH: TSH: 1.3 u[IU]/mL (ref 0.35–5.50)

## 2024-04-05 LAB — HEMOGLOBIN A1C: Hgb A1c MFr Bld: 4.1 % — ABNORMAL LOW (ref 4.6–6.5)

## 2024-04-05 NOTE — Patient Instructions (Signed)
-  It was nice to meet you and look forward to taking care of you.  -Physical exam completed today. No concerns. -Work on a healthy diet and continue participating in regular exercise. -Ordered labs (CBC, CMP, A1c, Lipid panel, and TSH) based on physical exam and BMI-obesity. Office will call with lab results and will be available on MyChart.  -Follow up in 1 year for a physical.

## 2024-04-05 NOTE — Progress Notes (Signed)
 New Patient Office Visit  Subjective   Patient ID: Andrew Conway, male    DOB: September 10, 1990  Age: 33 y.o. MRN: [id number]  CC:  Chief Complaint  Patient presents with   Establish Care    HPI Andrew Conway presents to establish care with new provider.   Patients previous primary care provider: None   Specialist: None   No concerns.  Diet: General  Exercise: Cardio and weight training 3-4 times a week Eye exam: Not had within last year Dental: Regular dental exam.  Outpatient Encounter Medications as of 04/05/2024  Medication Sig   [DISCONTINUED] hydrOXYzine  (ATARAX /VISTARIL ) 10 MG tablet Take 1-3 tablets (10-30 mg total) by mouth 3 (three) times daily as needed.   [DISCONTINUED] ondansetron  (ZOFRAN ) 8 MG tablet Take 1 tablet (8 mg total) by mouth every 8 (eight) hours as needed for nausea or vomiting.   No facility-administered encounter medications on file as of 04/05/2024.    History reviewed. No pertinent past medical history.  History reviewed. No pertinent surgical history.  Family History  Problem Relation Age of Onset   Autism Son    Diabetes Maternal Grandmother    Cancer Paternal Grandfather        Prostate    Social History   Socioeconomic History   Marital status: Married    Spouse name: Not on file   Number of children: 2   Years of education: Not on file   Highest education level: High school graduate  Occupational History   Not on file  Tobacco Use   Smoking status: Never    Passive exposure: Never   Smokeless tobacco: Never  Vaping Use   Vaping status: Never Used  Substance and Sexual Activity   Alcohol use: Not Currently   Drug use: Never   Sexual activity: Yes    Birth control/protection: None  Other Topics Concern   Not on file  Social History Narrative   Not on file   Social Drivers of Health   Financial Resource Strain: Low Risk  (04/05/2024)   Overall Financial Resource Strain (CARDIA)    Difficulty of Paying Living  Expenses: Not hard at all  Food Insecurity: No Food Insecurity (04/05/2024)   Hunger Vital Sign    Worried About Running Out of Food in the Last Year: Never true    Ran Out of Food in the Last Year: Never true  Transportation Needs: No Transportation Needs (04/05/2024)   PRAPARE - Administrator, Civil Service (Medical): No    Lack of Transportation (Non-Medical): No  Physical Activity: Sufficiently Active (04/05/2024)   Exercise Vital Sign    Days of Exercise per Week: 4 days    Minutes of Exercise per Session: 60 min  Stress: No Stress Concern Present (04/05/2024)   [employer] of Occupational Health - Occupational Stress Questionnaire    Feeling of Stress: Not at all  Social Connections: Moderately Integrated (04/05/2024)   Social Connection and Isolation Panel    Frequency of Communication with Friends and Family: More than three times a week    Frequency of Social Gatherings with Friends and Family: Three times a week    Attends Religious Services: 1 to 4 times per year    Active Member of Clubs or Organizations: No    Attends Banker Meetings: Never    Marital Status: Married  Catering manager Violence: Not At Risk (04/05/2024)   Humiliation, Afraid, Rape, and Kick questionnaire    Fear of Current or  Ex-Partner: No    Emotionally Abused: No    Physically Abused: No    Sexually Abused: No    ROS See HPI above    Objective  BP 110/88   Pulse (!) 105   Temp 98 F (36.7 C) (Oral)   Ht 5' 5 (1.651 m)   Wt 183 lb (83 kg)   SpO2 95%   BMI 30.45 kg/m   Physical Exam Vitals reviewed.  Constitutional:      General: He is not in acute distress.    Appearance: Normal appearance. He is obese. He is not ill-appearing, toxic-appearing or diaphoretic.  HENT:     Head: Normocephalic and atraumatic.     Right Ear: Tympanic membrane, ear canal and external ear normal. There is no impacted cerumen.     Left Ear: Tympanic membrane, ear canal and  external ear normal. There is no impacted cerumen.     Nose:     Right Sinus: No maxillary sinus tenderness or frontal sinus tenderness.     Left Sinus: No maxillary sinus tenderness or frontal sinus tenderness.     Mouth/Throat:     Mouth: Mucous membranes are moist.     Pharynx: Oropharynx is clear. Uvula midline. No pharyngeal swelling, oropharyngeal exudate, posterior oropharyngeal erythema or uvula swelling.  Eyes:     General:        Right eye: No discharge.        Left eye: No discharge.     Conjunctiva/sclera: Conjunctivae normal.     Pupils: Pupils are equal, round, and reactive to light.  Neck:     Thyroid: No thyromegaly.  Cardiovascular:     Rate and Rhythm: Normal rate and regular rhythm.     Pulses:          Posterior tibial pulses are 3+ on the right side and 3+ on the left side.     Heart sounds: Normal heart sounds. No murmur heard.    No friction rub. No gallop.  Pulmonary:     Effort: Pulmonary effort is normal. No respiratory distress.     Breath sounds: Normal breath sounds.  Abdominal:     General: Abdomen is flat. Bowel sounds are normal. There is no distension.     Palpations: Abdomen is soft. There is no mass.     Tenderness: There is no abdominal tenderness.  Musculoskeletal:        General: Normal range of motion.     Cervical back: Normal range of motion.     Right lower leg: No edema.     Left lower leg: No edema.  Lymphadenopathy:     Head:     Right side of head: No submental or submandibular adenopathy.     Left side of head: No submental or submandibular adenopathy.     Cervical: No cervical adenopathy.  Skin:    General: Skin is warm and dry.  Neurological:     General: No focal deficit present.     Mental Status: He is alert and oriented to person, place, and time. Mental status is at baseline.     Motor: No weakness.     Gait: Gait normal.  Psychiatric:        Mood and Affect: Mood normal.        Behavior: Behavior normal.         Thought Content: Thought content normal.        Judgment: Judgment normal.      Assessment &  Plan:  Encounter to establish care  Annual physical exam -     CBC with Differential/Platelet -     Comprehensive metabolic panel with GFR -     Hemoglobin A1c -     Lipid panel -     TSH  Class 1 obesity due to excess calories without serious comorbidity with body mass index (BMI) of 30.0 to 30.9 in adult -     CBC with Differential/Platelet -     Comprehensive metabolic panel with GFR -     Hemoglobin A1c -     Lipid panel -     TSH  1.Review health maintenance:  -Covid booster: Declines  -Tdap vaccine: Declines, maybe next time  -Hep B vaccine: Maybe, declines for today -HIV and Hep C screening: Order -HPV vaccine: Not had  -Influenza vaccine: Declines  2.Physical exam completed today. No concerns. 3.Work on a healthy diet and continue participating in regular exercise. 4.Ordered labs (CBC, CMP, A1c, Lipid panel, and TSH) based on physical exam and BMI-obesity. Return in about 1 year (around 04/05/2025) for physical.   Amarylis Rovito, NP

## 2024-04-08 NOTE — Addendum Note (Signed)
 Addended by: ELNER NANNY B on: 04/08/2024 04:55 PM   Modules accepted: Orders

## 2024-04-09 ENCOUNTER — Telehealth: Payer: Self-pay | Admitting: Family Medicine

## 2024-04-09 NOTE — Telephone Encounter (Signed)
 Pt notified location changed.

## 2024-04-09 NOTE — Telephone Encounter (Signed)
 Copied from CRM #[id number]. Topic: Referral - Question >> Apr 09, 2024  1:22 PM Deleta RAMAN wrote: Reason for CRM:  patient spouse is calling to see if patient can be referred to [facility] urology location instead of [place] location. Please contact patient at [phone]

## 2024-04-10 ENCOUNTER — Ambulatory Visit (HOSPITAL_COMMUNITY)
Admission: RE | Admit: 2024-04-10 | Discharge: 2024-04-10 | Disposition: A | Discharge status: Home / Self Care | Source: Ambulatory Visit | Attending: Family Medicine | Admitting: Family Medicine

## 2024-04-10 DIAGNOSIS — R748 Abnormal levels of other serum enzymes: Secondary | ICD-10-CM | POA: Insufficient documentation

## 2024-04-16 ENCOUNTER — Ambulatory Visit: Payer: Self-pay

## 2024-04-16 ENCOUNTER — Ambulatory Visit: Payer: Self-pay | Admitting: Family Medicine

## 2024-04-16 NOTE — Telephone Encounter (Signed)
 FYI Only or Action Required?: Action required by provider: clinical question for provider.  Patient was last seen in primary care on 04/05/2024 by Billy Philippe SAUNDERS, NP.  Called Nurse Triage reporting Advice Only.  Symptoms began information only.  Interventions attempted: Other: information only.  Symptoms are: information only .  Triage Disposition: Call PCP When Office is Open  Patient/caregiver understands and will follow disposition?: Yes                             Copied from CRM [id number]. Topic: Clinical - Lab/Test Results >> Apr 16, 2024  4:36 PM Wess RAMAN wrote: Reason for CRM: Patient has further questions about labs Reason for Disposition  [1] Caller requesting NON-URGENT health information AND [2] PCP's office is the best resource  Answer Assessment - Initial Assessment Questions 1. REASON FOR CALL: What is the main reason for your call? or How can I best help you?     Patient has additional questions regarding recent lab results:  1. Is there anything he can do to get rid of the gallbladder polyp?  2. Can he still eat spicy foods?  3. Patient would like to add wife, Thurnell Nora, to [facility]. This RN advised that a form is typically filled out in the office for this. Patient stated he filled out a form the last time he was in the office. Please advise.  Protocols used: Information Only Call - No Triage-A-AH

## 2024-04-17 NOTE — Telephone Encounter (Signed)
 Called pt and answered pt concerns and question and pt had no other concerns.

## 2025-04-07 ENCOUNTER — Encounter: Admitting: Family Medicine
